# Patient Record
Sex: Male | Born: 2000 | Race: Black or African American | Hispanic: No | Marital: Single | Smoking: Never smoker
Health system: Southern US, Community
[De-identification: ages and names within clinical notes are randomized; demographics above are authoritative.]

---
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
## 2021-06-05 IMAGING — CR CHEST - 2 VIEW
2 series · 2 of 2 positions shown · non-contrast
Comparison: None

CLINICAL DATA: Cough.  SOB for 3 days.

EXAM:
CHEST - 2 VIEW

[w chest pa]
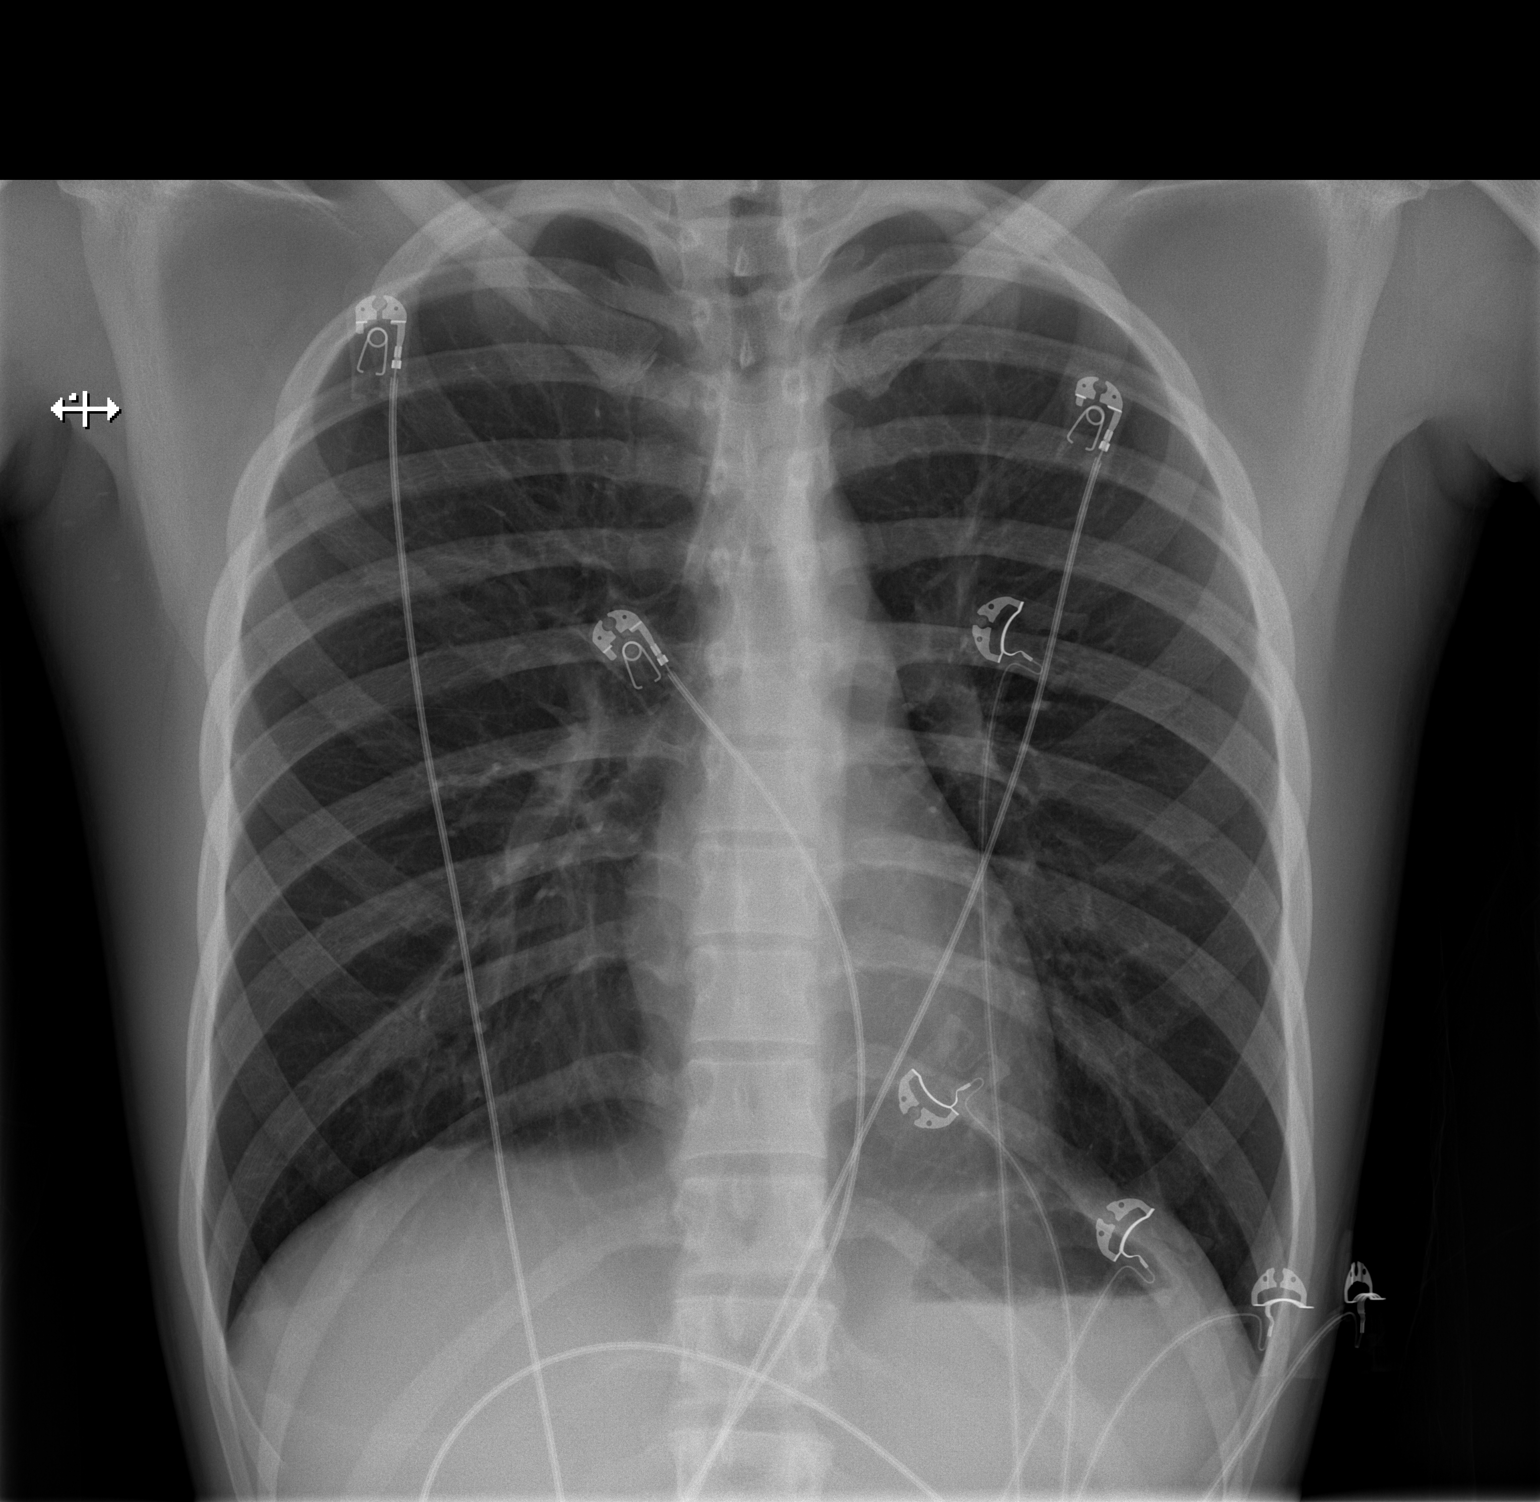

[w chest lat]
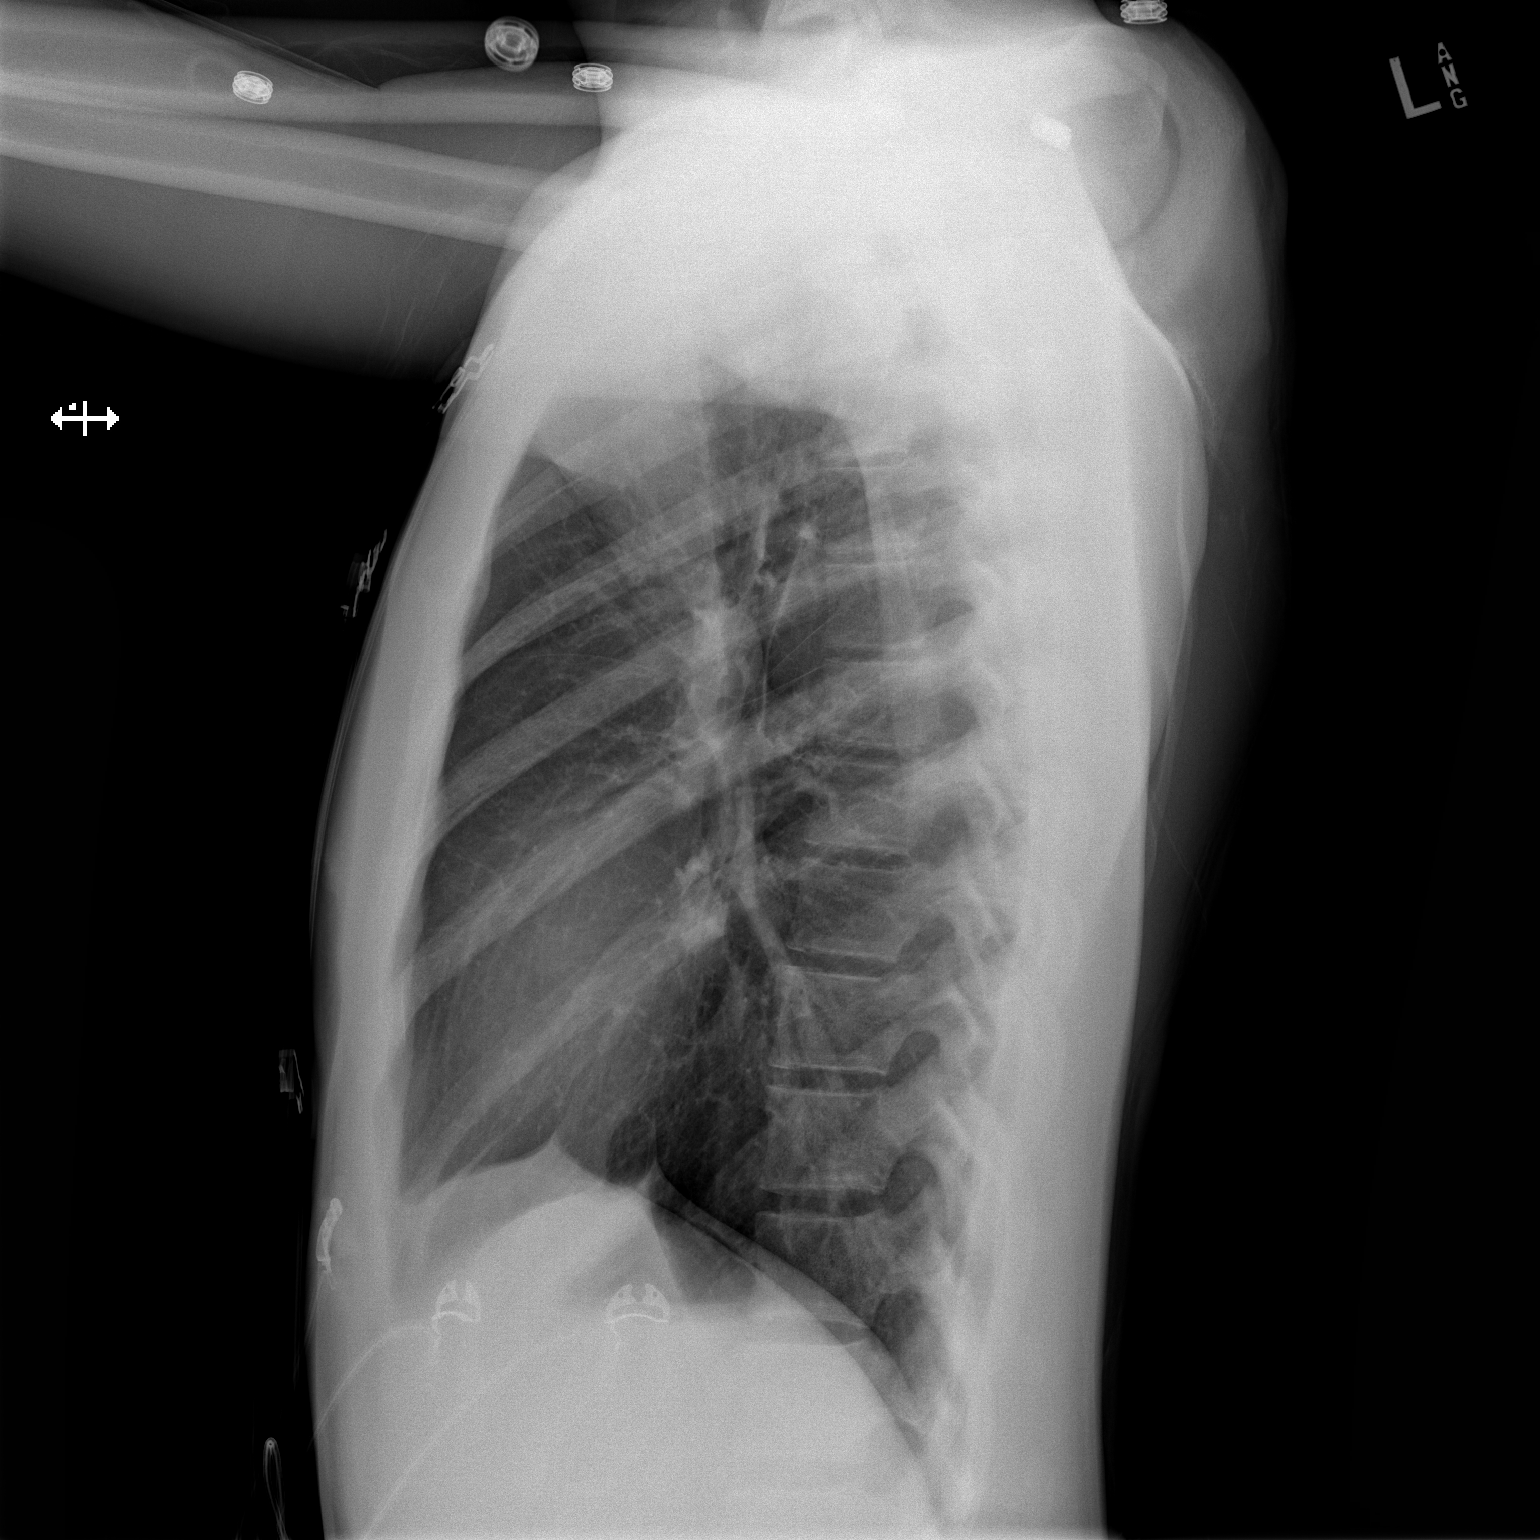

[2 of 2 positions shown; findings below may reference images not displayed]

FINDINGS: The heart size and mediastinal contours are within normal limits.
Both lungs are clear. The visualized skeletal structures are
unremarkable.
IMPRESSION: No active cardiopulmonary disease.

## 2022-06-19 ENCOUNTER — Emergency Department (HOSPITAL_COMMUNITY)
Admission: EM | Admit: 2022-06-19 | Discharge: 2022-06-19 | Disposition: A | Discharge status: Home / Self Care | Payer: Medicaid Other | Attending: Emergency Medicine | Admitting: Emergency Medicine

## 2022-06-19 ENCOUNTER — Other Ambulatory Visit: Payer: Self-pay

## 2022-06-19 DIAGNOSIS — K029 Dental caries, unspecified: Secondary | ICD-10-CM | POA: Diagnosis not present

## 2022-06-19 DIAGNOSIS — K0889 Other specified disorders of teeth and supporting structures: Secondary | ICD-10-CM

## 2022-06-19 MED ORDER — AMOXICILLIN-POT CLAVULANATE 875-125 MG PO TABS: 1.0000 | ORAL_TABLET | Freq: Two times a day (BID) | ORAL | 0 refills | Status: AC

## 2022-06-19 MED ORDER — KETOROLAC TROMETHAMINE 15 MG/ML IJ SOLN
15.0000 mg | Freq: Once | INTRAMUSCULAR | Status: AC
  Administered 2022-06-19: 15 mg via INTRAMUSCULAR
  Filled 2022-06-19: qty 1

## 2022-06-19 MED ORDER — ETODOLAC 400 MG PO TABS: 400.0000 mg | ORAL_TABLET | Freq: Two times a day (BID) | ORAL | 0 refills | Status: AC

## 2022-06-19 NOTE — ED Provider Notes (Signed)
[place] EMERGENCY DEPARTMENT AT [facility] Provider Note   CSN: [id number] Arrival date & time: 06/19/22  1216     History  Chief Complaint  Patient presents with   Dental Pain    John Day is a 22 y.o. male.  22 year old male presents today for left-sided dental swelling that has been intermittently ongoing for about 6 months.  He states this particular episode started about 3 days ago.  No significant facial swelling.  He states he had minimal swelling yesterday which improved following warm compress.  Does not have a dentist.  No fever, drainage or other complaints.  He states this is at the site of the left lower wisdom tooth.  Has not taken anything prior to arrival.  The history is provided by the patient. No language interpreter was used.       Home Medications Prior to Admission medications   Medication Sig Start Date End Date Taking? Authorizing Provider  amitriptyline (ELAVIL) 25 MG tablet Take 1 tablet (25 mg total) by mouth at bedtime. 09/29/18   Keturah Shavers, MD  Magnesium Oxide 500 MG TABS Take 1 tablet (500 mg total) by mouth daily. 09/29/18   Keturah Shavers, MD  riboflavin (VITAMIN B-2) 100 MG TABS tablet Take 1 tablet (100 mg total) by mouth daily. 09/29/18   Keturah Shavers, MD  sucralfate (CARAFATE) 1 g tablet Take 1 tablet (1 g total) by mouth 4 (four) times daily as needed. 09/18/18   Sabas Sous, MD  SUMAtriptan (IMITREX) 50 MG tablet Take 1 tablet for moderate to severe headache with or without 400 mg of ibuprofen 09/29/18   Keturah Shavers, MD      Allergies    Patient has no known allergies.    Review of Systems   Review of Systems  Constitutional:  Negative for fever.  HENT:  Positive for dental problem. Negative for drooling and trouble swallowing.   All other systems reviewed and are negative.   Physical Exam Updated Vital Signs BP (!) 98/56 (BP Location: Right Arm)   Pulse 98   Temp 99.5 F (37.5 C)   Resp 18   Ht 6'  (1.829 m)   Wt 68 kg   SpO2 99%   BMI 20.34 kg/m  Physical Exam Vitals and nursing note reviewed.  Constitutional:      General: He is not in acute distress.    Appearance: Normal appearance. He is not ill-appearing.  HENT:     Head: Normocephalic and atraumatic.     Nose: Nose normal.     Mouth/Throat:     Mouth: Mucous membranes are moist.     Pharynx: No posterior oropharyngeal erythema.     Comments: Decayed left most posterior molar noted on the bottom.  No surrounding swelling.  No evidence of dental abscess.  No evidence of peritonsillar abscess, Ludwig's angina, or retropharyngeal abscess. Eyes:     Conjunctiva/sclera: Conjunctivae normal.  Cardiovascular:     Rate and Rhythm: Normal rate.  Pulmonary:     Effort: Pulmonary effort is normal. No respiratory distress.  Musculoskeletal:        General: No deformity.  Skin:    Findings: No rash.  Neurological:     Mental Status: He is alert.     ED Results / Procedures / Treatments   Labs (all labs ordered are listed, but only abnormal results are displayed) Labs Reviewed - No data to display  EKG None  Radiology No results found.  Procedures  Procedures    Medications Ordered in ED Medications - No data to display  ED Course/ Medical Decision Making/ A&P                             Medical Decision Making  22 year old male presents today for evaluation of dental pain ongoing for the past 6 months intermittently.  This episode has been going on for the past 3 days.  On exam he does not have any concerning features.  No evidence of dental abscess, retropharyngeal abscess, Ludwig's angina, or peritonsillar abscess.  Decayed wisdom tooth noted.  Likely the source of his pain.  Will give shot of Toradol in the emergency department.  Lodine prescribed.  Augmentin prescribed.  Dental resources given.  On-call dental information given.   Final Clinical Impression(s) / ED Diagnoses Final diagnoses:  Pain, dental     Rx / DC Orders ED Discharge Orders          Ordered    etodolac (LODINE) 400 MG tablet  2 times daily        06/19/22 1353    amoxicillin-clavulanate (AUGMENTIN) 875-125 MG tablet  Every 12 hours        06/19/22 1353              Marita Kansas, PA-C 06/19/22 1354    Terrilee Files, MD 06/19/22 [phone]

## 2022-06-19 NOTE — ED Notes (Signed)
Pt standing in hallway, pt states that he is ready to go home, pt verbalized understanding d/c and follow up, pt ambulatory from dpt.

## 2022-06-19 NOTE — ED Triage Notes (Signed)
Pt with left sided dental pain for six months. No dental appointment.

## 2022-06-19 NOTE — ED Notes (Signed)
Pt in bed, pt c/o dental pain, pt has broken rear L lower molar.

## 2022-06-19 NOTE — Discharge Instructions (Addendum)
You will need to follow-up with a dentist.  I have sent in Lodine which is an anti-inflammatory.  Do not combine this with ibuprofen or Aleve.  I have given you dental follow-up with our on-call dentist as well as additional dental resource guide.  For any concerning symptoms return to the emergency room.

## 2023-10-16 ENCOUNTER — Emergency Department (HOSPITAL_COMMUNITY)
Admission: EM | Admit: 2023-10-16 | Discharge: 2023-10-17 | Disposition: A | Discharge status: Home / Self Care | Attending: Emergency Medicine | Admitting: Emergency Medicine

## 2023-10-16 DIAGNOSIS — R0789 Other chest pain: Secondary | ICD-10-CM | POA: Diagnosis present

## 2023-10-17 ENCOUNTER — Encounter (HOSPITAL_COMMUNITY): Payer: Self-pay

## 2023-10-17 ENCOUNTER — Emergency Department (HOSPITAL_COMMUNITY)

## 2023-10-17 LAB — CBC
HCT: 44.4 % (ref 39.0–52.0)
Hemoglobin: 14.5 g/dL (ref 13.0–17.0)
MCH: 29.1 pg (ref 26.0–34.0)
MCHC: 32.7 g/dL (ref 30.0–36.0)
MCV: 89.2 fL (ref 80.0–100.0)
Platelets: 381 K/uL (ref 150–400)
RBC: 4.98 MIL/uL (ref 4.22–5.81)
RDW: 13.5 % (ref 11.5–15.5)
WBC: 7.9 K/uL (ref 4.0–10.5)
nRBC: 0 % (ref 0.0–0.2)

## 2023-10-17 LAB — BASIC METABOLIC PANEL WITH GFR
Anion gap: 13 (ref 5–15)
BUN: 10 mg/dL (ref 6–20)
CO2: 24 mmol/L (ref 22–32)
Calcium: 9.5 mg/dL (ref 8.9–10.3)
Chloride: 104 mmol/L (ref 98–111)
Creatinine, Ser: 1.21 mg/dL (ref 0.61–1.24)
GFR, Estimated: >60 mL/min (ref >60)
Glucose, Bld: 173 mg/dL — ABNORMAL HIGH (ref 70–99)
Potassium: 3.5 mmol/L (ref 3.5–5.1)
Sodium: 141 mmol/L (ref 135–145)

## 2023-10-17 LAB — TROPONIN T, HIGH SENSITIVITY: Troponin T High Sensitivity: <15 ng/L (ref 0–19)

## 2023-10-17 LAB — D-DIMER, QUANTITATIVE: D-Dimer, Quant: <0.27 ug{FEU}/mL (ref 0.00–0.50)

## 2023-10-17 MED ORDER — KETOROLAC TROMETHAMINE 30 MG/ML IJ SOLN
30.0000 mg | Freq: Once | INTRAMUSCULAR | Status: AC
  Administered 2023-10-17: 30 mg via INTRAVENOUS
  Filled 2023-10-17: qty 1

## 2023-10-17 NOTE — ED Triage Notes (Signed)
 Pt complaint of chest, shortness of breath, after smoking Marihuana vape, pt report being a regular user. However, he was trying a new brand.   Chest hurt 7 out 10, report shortness of breath. Feels heavy on chest.  Alert and oriented

## 2023-10-17 NOTE — ED Provider Notes (Signed)
 Emergency Department Provider Note   I have reviewed the triage vital signs and the nursing notes.   HISTORY  Chief Complaint Chest Pain   HPI John Day is a 23 y.o. male with no significant past medical history presents emergency department with acute onset chest discomfort with shortness of breath.  This occurred in the setting of smoking both marijuana and a vape pen.  He states the brand of the vape liquid was new to him.  He had acute onset sharp chest pain, 7/10 in severity which has improved slightly at this point.  He is not feeling significant shortness of breath currently.  No diaphoresis or nausea/vomiting.  No similar symptoms in the past.  No leg pain or swelling.  History reviewed. No pertinent past medical history.  Review of Systems  Constitutional: No fever/chills Cardiovascular: Positive chest pain. Respiratory: Positive shortness of breath. Gastrointestinal: No abdominal pain.  No nausea, no vomiting.   Skin: Negative for rash. Neurological: Negative for headaches.  ____________________________________________   PHYSICAL EXAM:  VITAL SIGNS: ED Triage Vitals [10/17/23 0007]  Encounter Vitals Group     BP (!) 143/68     Pulse Rate 85     Resp 16     Temp (!) 97.4 F (36.3 C)     Temp Source Oral     SpO2 100 %   Constitutional: Alert and oriented. Well appearing and in no acute distress. Eyes: Conjunctivae are normal.  Head: Atraumatic. Nose: No congestion/rhinnorhea. Mouth/Throat: Mucous membranes are moist. Neck: No stridor.   Cardiovascular: Normal rate, regular rhythm. Good peripheral circulation. Grossly normal heart sounds.   Respiratory: Normal respiratory effort.  No retractions. Lungs CTAB. Gastrointestinal: Soft and nontender. No distention.  Musculoskeletal: No lower extremity tenderness nor edema. No gross deformities of extremities. Neurologic:  Normal speech and language.  Skin:  Skin is warm, dry and intact. No rash  noted.  ____________________________________________   LABS (all labs ordered are listed, but only abnormal results are displayed)  Labs Reviewed  BASIC METABOLIC PANEL WITH GFR - Abnormal; Notable for the following components:      Result Value   Glucose, Bld 173 (*)    All other components within normal limits  CBC  D-DIMER, QUANTITATIVE  TROPONIN T, HIGH SENSITIVITY  TROPONIN T, HIGH SENSITIVITY   ____________________________________________  EKG   EKG Interpretation Date/Time:  Sunday October 17 2023 00:08:05 EDT Ventricular Rate:  80 PR Interval:  139 QRS Duration:  114 QT Interval:  361 QTC Calculation: 417 R Axis:   94  Text Interpretation: Sinus rhythm Incomplete right bundle branch block ST elev, probable normal early repol pattern Confirmed by Darra Chew [phone]) on 10/17/2023 12:58:22 AM        ____________________________________________  RADIOLOGY  DG Chest 2 View Result Date: 10/17/2023 CLINICAL DATA:  chest pain EXAM: CHEST - 2 VIEW COMPARISON:  Chest x-ray 09/18/2018 FINDINGS: The heart and mediastinal contours are within normal limits. No focal consolidation. No pulmonary edema. No pleural effusion. No pneumothorax. No acute osseous abnormality. IMPRESSION: No active cardiopulmonary disease. Electronically Signed   By: Morgane  Naveau M.D.   On: 10/17/2023 00:43    ____________________________________________   PROCEDURES  Procedure(s) performed:   Procedures  None  ____________________________________________   INITIAL IMPRESSION / ASSESSMENT AND PLAN / ED COURSE  Pertinent labs & imaging results that were available during my care of the patient were reviewed by me and considered in my medical decision making (see chart for details).  This patient is Presenting for Evaluation of CP, which does require a range of treatment options, and is a complaint that involves a high risk of morbidity and mortality.  The Differential Diagnoses  includes but is not exclusive to acute coronary syndrome, aortic dissection, pulmonary embolism, cardiac tamponade, community-acquired pneumonia, pericarditis, musculoskeletal chest wall pain, etc.   Critical Interventions-    Medications  ketorolac  (TORADOL ) 30 MG/ML injection 30 mg (30 mg Intravenous Given 10/17/23 0117)    Reassessment after intervention: symptoms improved.   Clinical Laboratory Tests Ordered, included troponin and D-dimer negative.  No acute kidney injury.  Normal potassium.  No anemia.  Radiologic Tests Ordered, included CXR. I independently interpreted the images and agree with radiology interpretation.   Cardiac Monitor Tracing which shows NSR.    Social Determinants of Health Risk positive vape use and THC use.    Medical Decision Making: Summary:  Patient presents emergency department with acute onset chest pain and shortness of breath.  This was in the setting of smoking/using a vape pen.  Pain quality is fairly atypical and sharp for ACS.  I will obtain screening blood work including troponin and D-dimer however. CXR clear without pulmonary edema, PNX, or infiltrate.   Reevaluation with update and discussion with patient.  Chest symptoms improved with Toradol .  Discussed smoking/vape cessation and likely cause of symptoms.  Plan for discharge.  Considered admission no lab abnormalities to prompt further workup or admit.  Stable for discharge.  Patient's presentation is most consistent with acute presentation with potential threat to life or bodily function.   Disposition: discharge  ____________________________________________  FINAL CLINICAL IMPRESSION(S) / ED DIAGNOSES  Final diagnoses:  Atypical chest pain    Note:  This document was prepared using Dragon voice recognition software and may include unintentional dictation errors.  Fonda Law, MD, [facility]    Christo Hain, Fonda MATSU, MD 10/17/23 [phone]

## 2023-10-17 NOTE — Discharge Instructions (Signed)

## 2023-12-23 ENCOUNTER — Ambulatory Visit: Admission: EM | Admit: 2023-12-23 | Discharge: 2023-12-23 | Disposition: A | Discharge status: Home / Self Care

## 2023-12-23 ENCOUNTER — Encounter: Payer: Self-pay | Admitting: Emergency Medicine

## 2023-12-23 DIAGNOSIS — J069 Acute upper respiratory infection, unspecified: Secondary | ICD-10-CM

## 2023-12-23 LAB — POC SOFIA SARS ANTIGEN FIA: SARS Coronavirus 2 Ag: NEGATIVE

## 2023-12-23 LAB — POCT INFLUENZA A/B
Influenza A, POC: NEGATIVE
Influenza B, POC: NEGATIVE

## 2023-12-23 NOTE — ED Provider Notes (Signed)
 [facility]    CSN: [id number] Arrival date & time: 12/23/23  0808      History   Chief Complaint Chief Complaint  Patient presents with   URI   Fever    HPI John Day is a 23 y.o. male.   Pt presents today due to 4 days worth of body aches, nausea, cough productive of white sputum, subjective fever, loss of appetite, and nasal congestion. Pt states that he has been taking Mucinex for symptoms with no significant relief. Pt states that his brother is sick as well but believes that he passed his illness on to his brother.   The history is provided by the patient.  URI Presenting symptoms: fever   Fever   History reviewed. No pertinent past medical history.  Patient Active Problem List   Diagnosis Date Noted   Acute pain of right shoulder 05/18/2019   Back pain 05/18/2019    History reviewed. No pertinent surgical history.     Home Medications    Prior to Admission medications   Medication Sig Start Date End Date Taking? Authorizing Provider  omeprazole (PRILOSEC) 20 MG capsule take 1 capsule (20 mg) by mouth once daily before a meal for 30 days. Call clinic if headaches worsen while taking. 09/20/18  Yes [provider]  amitriptyline  (ELAVIL ) 25 MG tablet Take 1 tablet (25 mg total) by mouth at bedtime. Patient not taking: Reported on 10/17/2023 09/29/18   Corinthia Blossom, MD  amoxicillin -clavulanate (AUGMENTIN ) 875-125 MG tablet Take 1 tablet by mouth every 12 (twelve) hours. Patient not taking: Reported on 10/17/2023 06/19/22   Hildegard Loge, PA-C  etodolac  (LODINE ) 400 MG tablet Take 1 tablet (400 mg total) by mouth 2 (two) times daily. Patient not taking: Reported on 10/17/2023 06/19/22   Hildegard Loge, PA-C  Magnesium  Oxide 500 MG TABS Take 1 tablet (500 mg total) by mouth daily. Patient not taking: Reported on 10/17/2023 09/29/18   Nabizadeh, Reza, MD  riboflavin (VITAMIN B-2) 100 MG TABS tablet Take 1 tablet (100 mg total) by mouth  daily. Patient not taking: Reported on 10/17/2023 09/29/18   Corinthia Blossom, MD  sucralfate  (CARAFATE ) 1 g tablet Take 1 tablet (1 g total) by mouth 4 (four) times daily as needed. Patient not taking: Reported on 10/17/2023 09/18/18   Theadore Ozell HERO, MD  SUMAtriptan  (IMITREX ) 50 MG tablet Take 1 tablet for moderate to severe headache with or without 400 mg of ibuprofen Patient not taking: Reported on 10/17/2023 09/29/18   Corinthia Blossom, MD    Family History Family History  Problem Relation Age of Onset   Migraines Neg Hx    Seizures Neg Hx    Autism Neg Hx    ADD / ADHD Neg Hx    Anxiety disorder Neg Hx    Depression Neg Hx    Bipolar disorder Neg Hx    Schizophrenia Neg Hx     Social History Social History   Tobacco Use   Smoking status: Never    Passive exposure: Current   Smokeless tobacco: Never  Vaping Use   Vaping status: Never Used  Substance Use Topics   Alcohol use: Never   Drug use: Yes    Types: Marijuana     Allergies   Patient has no known allergies.   Review of Systems Review of Systems  Constitutional:  Positive for fever.     Physical Exam Triage Vital Signs ED Triage Vitals  Encounter Vitals Group     BP  12/23/23 0901 (!) 145/69     Girls Systolic BP Percentile --      Girls Diastolic BP Percentile --      Boys Systolic BP Percentile --      Boys Diastolic BP Percentile --      Pulse Rate 12/23/23 0901 77     Resp 12/23/23 0901 16     Temp 12/23/23 0901 97.7 F (36.5 C)     Temp Source 12/23/23 0901 Oral     SpO2 12/23/23 0901 99 %     Weight 12/23/23 0900 145 lb (65.8 kg)     Height --      Head Circumference --      Peak Flow --      Pain Score 12/23/23 0859 7     Pain Loc --      Pain Education --      Exclude from Growth Chart --    No data found.  Updated Vital Signs BP (!) 145/69 (BP Location: Left Arm)   Pulse 77   Temp 97.7 F (36.5 C) (Oral)   Resp 16   Wt 145 lb (65.8 kg)   SpO2 99%   BMI 19.67 kg/m   Visual  Acuity Right Eye Distance:   Left Eye Distance:   Bilateral Distance:    Right Eye Near:   Left Eye Near:    Bilateral Near:     Physical Exam Vitals and nursing note reviewed.  Constitutional:      General: He is not in acute distress.    Appearance: Normal appearance. He is not ill-appearing, toxic-appearing or diaphoretic.  HENT:     Nose: Congestion (moderately enlarged turbinates) present. No rhinorrhea.     Mouth/Throat:     Mouth: Mucous membranes are moist.     Pharynx: Oropharynx is clear. No oropharyngeal exudate or posterior oropharyngeal erythema.  Eyes:     General: No scleral icterus. Cardiovascular:     Rate and Rhythm: Normal rate and regular rhythm.     Heart sounds: Normal heart sounds.  Pulmonary:     Effort: Pulmonary effort is normal. No respiratory distress.     Breath sounds: Normal breath sounds. No wheezing or rhonchi.  Skin:    General: Skin is warm.  Neurological:     Mental Status: He is alert and oriented to person, place, and time.  Psychiatric:        Mood and Affect: Mood normal.        Behavior: Behavior normal.      UC Treatments / Results  Labs (all labs ordered are listed, but only abnormal results are displayed) Labs Reviewed  POC SOFIA SARS ANTIGEN FIA  POCT INFLUENZA A/B    EKG   Radiology No results found.  Procedures Procedures (including critical care time)  Medications Ordered in UC Medications - No data to display  Initial Impression / Assessment and Plan / UC Course  I have reviewed the triage vital signs and the nursing notes.  Pertinent labs & imaging results that were available during my care of the patient were reviewed by me and considered in my medical decision making (see chart for details).   Final Clinical Impressions(s) / UC Diagnoses   Final diagnoses:  Viral URI     Discharge Instructions      Covid and flu are neg.   You been diagnosed with a viral illness today. -Viruses have to run  their course and medicines that are prescribed are meant  to help with symptoms. - With viruses usually feel poorly from 3 to 7 days with cough being the last symptoms to resolve.  -Cough can linger from days to weeks.  Antibiotics are not effective for viruses. -If your cough lasts more than 2 weeks and you are coughing so hard that you are vomiting or feel like you could pass out we need to follow-up with PCP for further testing and evaluation. -Rest, increase water intake, may use pseudoephedrine for nasal congestion, Delsym (dextromethorphan) or honey as needed for cough, and ibuprofen and/or Tylenol as directed on packaging for pain and fever. -If you have hypertension you should take Coricidin or other OTC meds approved for people with high blood pressure. -You may use a spoonful of honey every 4-6 hours as needed for throat pain and cough. -Warm tea with honey and lemon are helpful for soothe throat as well.  Chloraseptic and Cepacol make a throat lozenge with numbing medication, can be purchased over-the-counter. -May also use Flonase or sinus rinse for sinus pressure or nasal congestion.  Be sure to use distilled bottled water for sinus rinses. -May use coolmist humidifier to open up nasal passages -May elevate head to assist with postnasal drainage. -If you feel poorly (fever, fatigue, shortness of breath, nausea, etc.) for more than 10 days to be sure to follow-up with PCP or in clinic for further evaluation and additional treatments. If you experience chest pain with shortness of breath or pulse oxygen less than 95% you should report to the ER.      ED Prescriptions   None    PDMP not reviewed this encounter.   Andra Corean BROCKS, PA-C 12/23/23 [phone]

## 2023-12-23 NOTE — Discharge Instructions (Signed)
 Covid and flu are neg.   You been diagnosed with a viral illness today. -Viruses have to run their course and medicines that are prescribed are meant to help with symptoms. - With viruses usually feel poorly from 3 to 7 days with cough being the last symptoms to resolve.  -Cough can linger from days to weeks.  Antibiotics are not effective for viruses. -If your cough lasts more than 2 weeks and you are coughing so hard that you are vomiting or feel like you could pass out we need to follow-up with PCP for further testing and evaluation. -Rest, increase water intake, may use pseudoephedrine for nasal congestion, Delsym (dextromethorphan) or honey as needed for cough, and ibuprofen and/or Tylenol as directed on packaging for pain and fever. -If you have hypertension you should take Coricidin or other OTC meds approved for people with high blood pressure. -You may use a spoonful of honey every 4-6 hours as needed for throat pain and cough. -Warm tea with honey and lemon are helpful for soothe throat as well.  Chloraseptic and Cepacol make a throat lozenge with numbing medication, can be purchased over-the-counter. -May also use Flonase or sinus rinse for sinus pressure or nasal congestion.  Be sure to use distilled bottled water for sinus rinses. -May use coolmist humidifier to open up nasal passages -May elevate head to assist with postnasal drainage. -If you feel poorly (fever, fatigue, shortness of breath, nausea, etc.) for more than 10 days to be sure to follow-up with PCP or in clinic for further evaluation and additional treatments. If you experience chest pain with shortness of breath or pulse oxygen less than 95% you should report to the ER.

## 2023-12-23 NOTE — ED Triage Notes (Signed)
 Pt presents c/o URI x 4 days. Pt states,  I am so congested. My body aches. I got like you know a lot of flu sxs. I thought it was because of my job. 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 But, I got cough throat pain, and body aches too.   Pt denies emesis and diarrhea but does c/o nausea.
